# Patient Record
Sex: Male | Born: 1955 | Race: White | Hispanic: No | Marital: Single | Smoking: Never smoker
Health system: Southern US, Community
[De-identification: ages and names within clinical notes are randomized; demographics above are authoritative.]

## PROBLEM LIST (undated history)

## (undated) DIAGNOSIS — E785 Hyperlipidemia, unspecified: Secondary | ICD-10-CM

## (undated) DIAGNOSIS — K219 Gastro-esophageal reflux disease without esophagitis: Secondary | ICD-10-CM

## (undated) HISTORY — PX: BACK SURGERY: SHX140

## (undated) HISTORY — PX: KNEE SURGERY: SHX244

## (undated) HISTORY — DX: Hyperlipidemia, unspecified: E78.5

## (undated) HISTORY — DX: Gastro-esophageal reflux disease without esophagitis: K21.9

## (undated) HISTORY — PX: TONSILLECTOMY: SUR1361

---

## 2001-06-16 ENCOUNTER — Ambulatory Visit (HOSPITAL_COMMUNITY): Admission: RE | Admit: 2001-06-16 | Discharge: 2001-06-16 | Payer: Self-pay | Admitting: Orthopedic Surgery

## 2001-06-16 ENCOUNTER — Encounter: Payer: Self-pay | Admitting: Orthopedic Surgery

## 2001-06-27 ENCOUNTER — Ambulatory Visit (HOSPITAL_BASED_OUTPATIENT_CLINIC_OR_DEPARTMENT_OTHER): Admission: RE | Admit: 2001-06-27 | Discharge: 2001-06-28 | Payer: Self-pay | Admitting: Orthopedic Surgery

## 2006-07-02 ENCOUNTER — Ambulatory Visit (HOSPITAL_COMMUNITY): Admission: RE | Admit: 2006-07-02 | Discharge: 2006-07-02 | Payer: Self-pay | Admitting: *Deleted

## 2007-07-11 ENCOUNTER — Encounter (INDEPENDENT_AMBULATORY_CARE_PROVIDER_SITE_OTHER): Payer: Self-pay | Admitting: *Deleted

## 2007-07-11 ENCOUNTER — Ambulatory Visit (HOSPITAL_COMMUNITY): Admission: RE | Admit: 2007-07-11 | Discharge: 2007-07-11 | Payer: Self-pay | Admitting: *Deleted

## 2008-12-07 ENCOUNTER — Encounter: Admission: RE | Admit: 2008-12-07 | Discharge: 2008-12-07 | Payer: Self-pay | Admitting: *Deleted

## 2009-06-28 ENCOUNTER — Encounter (INDEPENDENT_AMBULATORY_CARE_PROVIDER_SITE_OTHER): Payer: Self-pay | Admitting: *Deleted

## 2009-06-28 ENCOUNTER — Ambulatory Visit (HOSPITAL_COMMUNITY): Admission: RE | Admit: 2009-06-28 | Discharge: 2009-06-28 | Payer: Self-pay | Admitting: *Deleted

## 2010-08-12 IMAGING — RF DG ESOPHAGUS
11 of 13 series · 19 of 24 positions shown · non-contrast
Comparison: None

CLINICAL DATA: Cervical dysphagia

ESOPHOGRAM / BARIUM SWALLOW
TECHNIQUE: Combined double contrast and single contrast
examination performed using effervescent crystals, thick barium
liquid, and thin barium liquid.
Fluoroscopy time:  1.6 minutes.

[Series 1: run · 2 of 7 slices shown (1 of 11)]
[im 1/7]
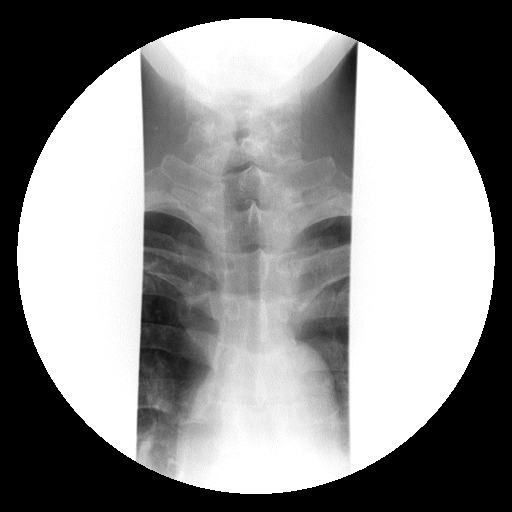
[im 7/7]
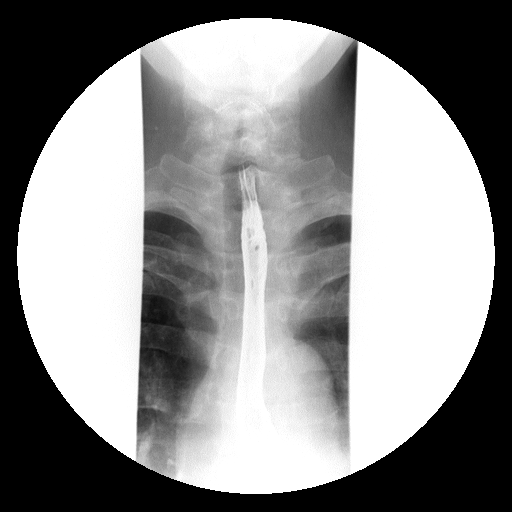

[Series 2: run · 1 of 6 slices shown (2 of 11)]
[im 6/6]
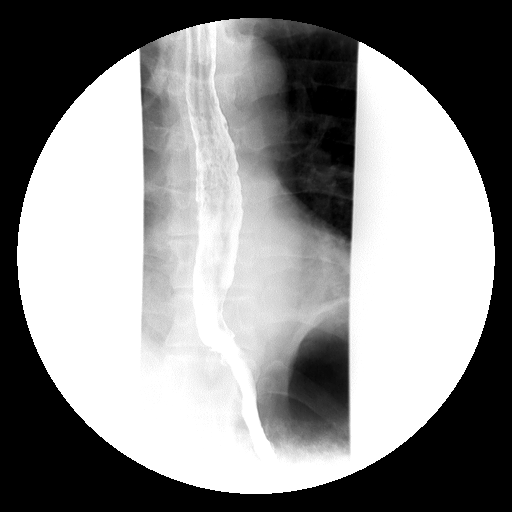

[Series 3: run · 1 of 1 slices shown (3 of 11)]
[im 1/1]
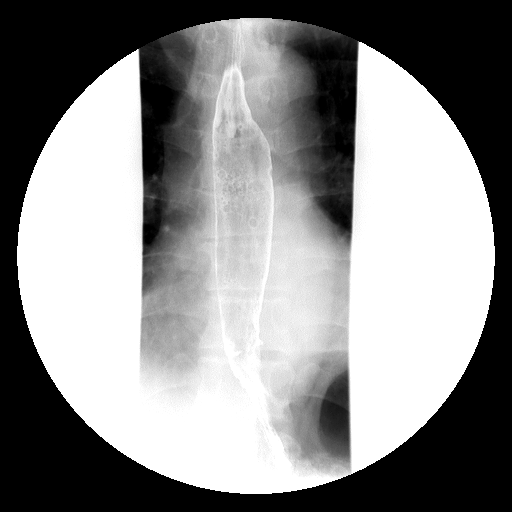

[Series 4: run · 1 of 2 slices shown (4 of 11)]
[im 1/2]
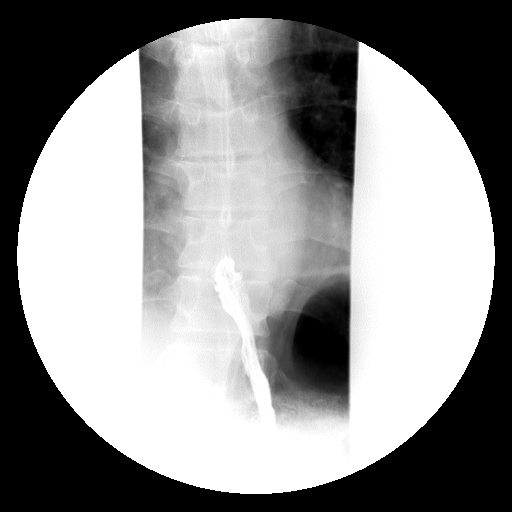

[Series 5: run · 3 of 8 slices shown (5 of 11)]
[im 1/8]
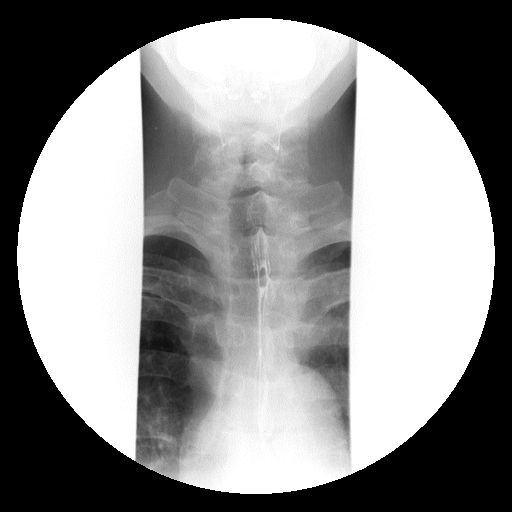
[im 5/8]
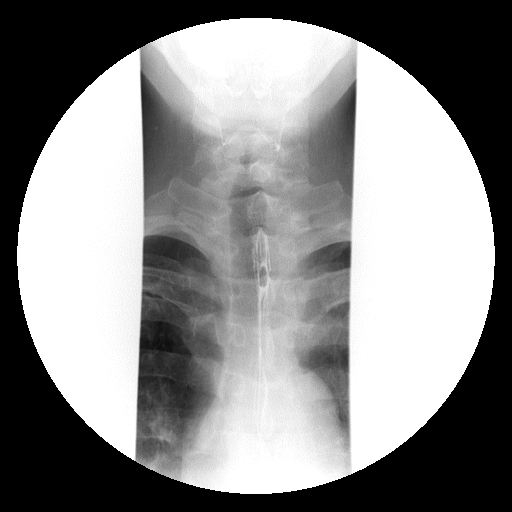
[im 8/8]
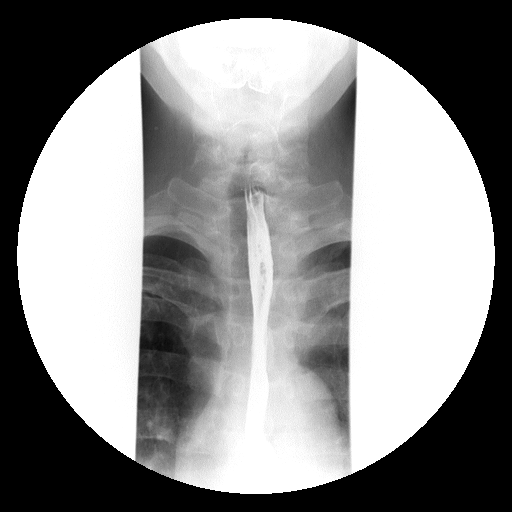

[Series 6: run · 2 of 7 slices shown (6 of 11)]
[im 1/7]
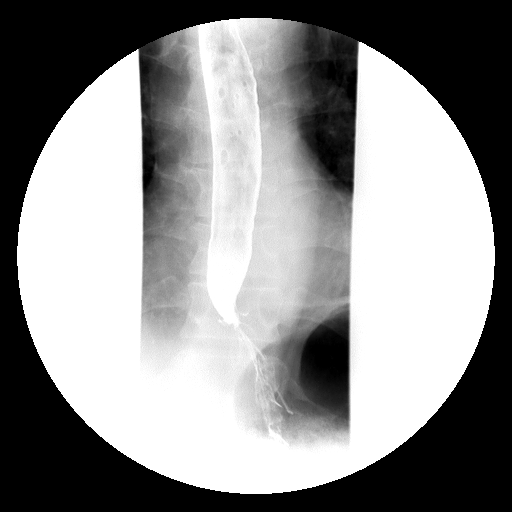
[im 7/7]
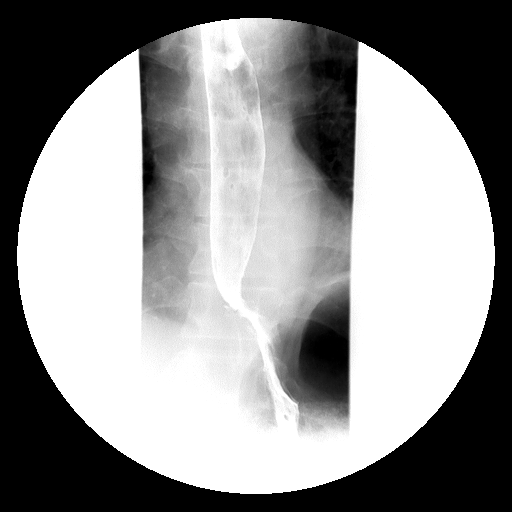

[Series 7: run · 3 of 7 slices shown (7 of 11)]
[im 1/7]
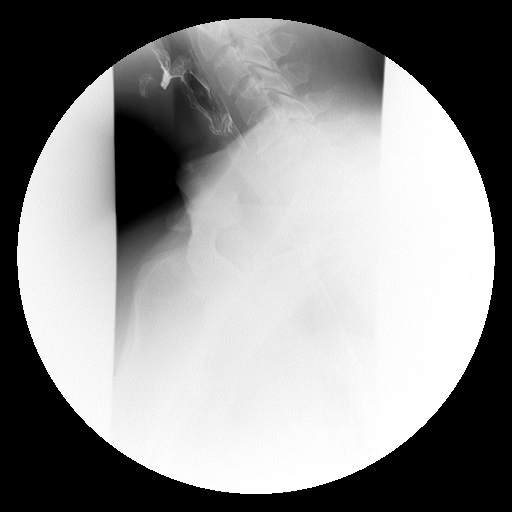
[im 4/7]
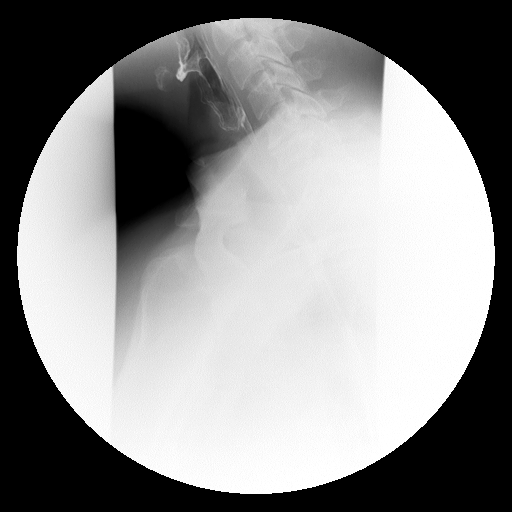
[im 7/7]
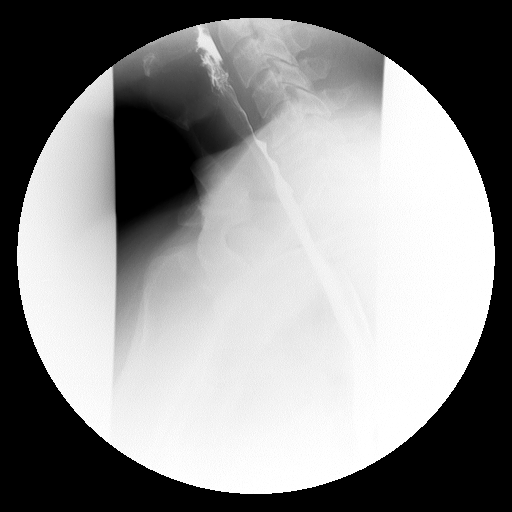

[Series 9: run · 3 of 7 slices shown (8 of 11)]
[im 1/7]
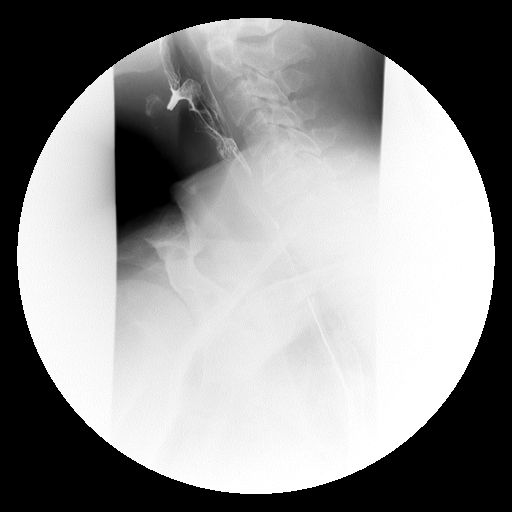
[im 4/7]
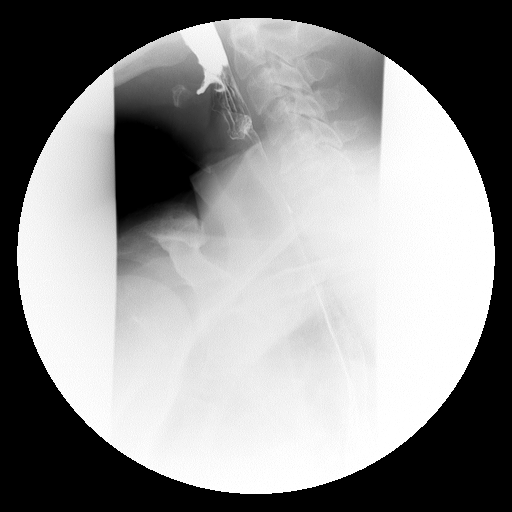
[im 7/7]
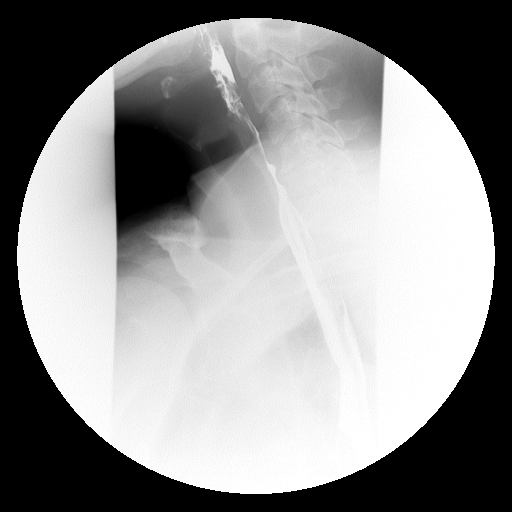

[Series 10: run · 1 of 2 slices shown (9 of 11)]
[im 1/2]
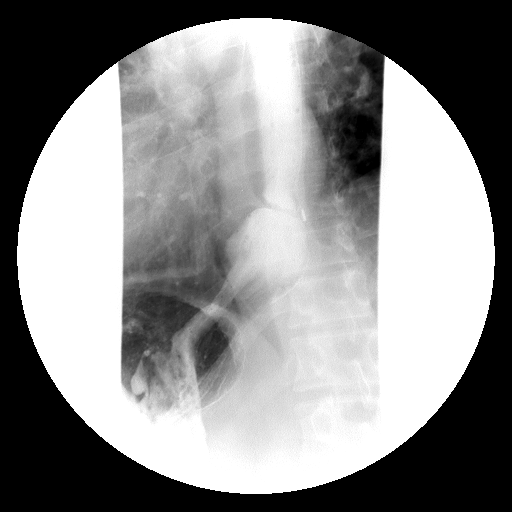

[Series 12: run · 1 of 1 slices shown (10 of 11)]
[im 1/1]
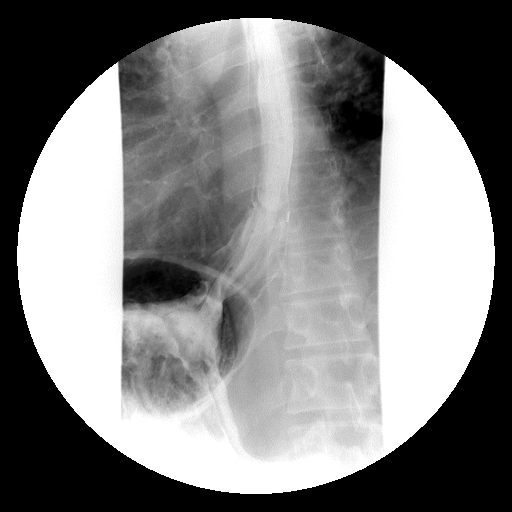

[Series 13: run · 1 of 1 slices shown (11 of 11)]
[im 1/1]
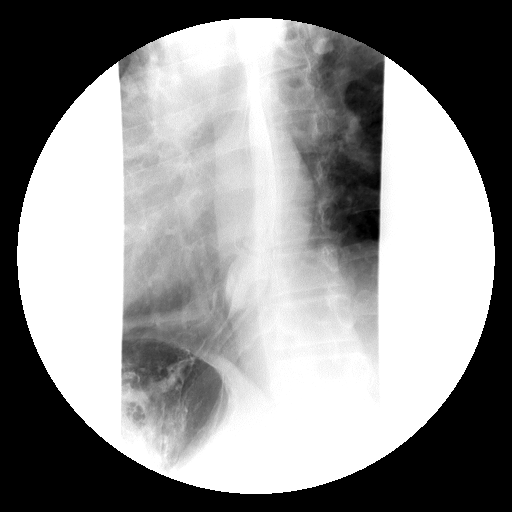

[19 of 24 positions shown; findings below may reference images not displayed]

FINDINGS: The mucosa and motility of the cervical cervical
esophagus are normal.  There is no evidence of an esophageal
stricture.

The patient has had a 5-6 cm sliding hiatal hernia best
demonstrated when the patient is in the prone oblique position.

There is also evidence of at least one and possibly two small
ulcerations in the distal esophagus at the gastroesophageal
junction.  However, there is no stricture or mass at that site.  A
13 mm barium tablet passed immediately from the mouth to the
stomach.
IMPRESSION: 1.  Small ulcerations in the distal esophagus at the
gastroesophageal junction just above the 6 cm sliding hiatal
hernia.
2.  No stricture.
3.  The cervical esophagus appears normal.

## 2011-05-02 NOTE — Op Note (Signed)
Peter Gonzalez, Peter Gonzalez               ACCOUNT NO.:  [id number]   MEDICAL RECORD NO.:  [id number]          PATIENT TYPE:  AMB   LOCATION:  ENDO                         FACILITY:  [facility]   PHYSICIAN:  Georgiana Spinner, M.D.    DATE OF BIRTH:  27-Mar-1956   DATE OF PROCEDURE:  07/11/2007  DATE OF DISCHARGE:                               OPERATIVE REPORT   PROCEDURE:  Upper endoscopy.   INDICATIONS:  Gastroesophageal reflux disease with some dysphagia.   ANESTHESIA:  Demerol 75, Versed 8 mg.   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  They were  photographed.  There was some mild erythema at the squamocolumnar  junction which was photographed and biopsied.  We entered into the  stomach.  The fundus, body, antrum, duodenal bulb, and second portion of  the duodenum all appeared normal.  From this point, the endoscope was  slowly withdrawn taking circumferential views of the duodenal mucosa  until the endoscope had been pulled back in the stomach and placed in  retroflexion to view the stomach from below, and once again, we biopsied  this erythematous change, it did not appeared to be Barrett's esophagus  clinically. The endoscope was straightened and withdrawn taking  circumferential views of the remaining gastric and esophageal mucosa.  The patient's vital signs and pulse oximeter remained stable.  The  patient tolerated the procedure well without apparent complication.   FINDINGS:  Mild erythema at the squamocolumnar junction with two fairly  large pseudodiverticula identified and photographed only.   PLAN:  Await biopsy report.  Continue the patient on therapy and have  the patient follow-up with me as an  outpatient.           ______________________________  Georgiana Spinner, M.D.     GMO/MEDQ  D:  07/11/2007  T:  07/11/2007  Job:  [id number]

## 2011-05-02 NOTE — Op Note (Signed)
Peter Gonzalez, Peter Gonzalez               ACCOUNT NO.:  [id number]   MEDICAL RECORD NO.:  [id number]          PATIENT TYPE:  AMB   LOCATION:  ENDO                         FACILITY:  [facility]   PHYSICIAN:  Georgiana Spinner, M.D.    DATE OF BIRTH:  1956-04-15   DATE OF PROCEDURE:  DATE OF DISCHARGE:                               OPERATIVE REPORT   PROCEDURE:  Upper endoscopy with biopsy.   INDICATIONS:  GERD.   ANESTHESIA:  Fentanyl 75 mcg, Versed 5 mg.   PROCEDURE:  With the patient mildly sedated in the left lateral  decubitus position the Pentax videoscopic endoscope was inserted in the  mouth and passed under direct vision through the esophagus which  appeared normal until we reached distal esophagus and there appeared to  be a rolled esophagus with a pseudodiverticulum which was photographed.  There was also an area of erythema that I could not tell if this was  Barrett's or just the mucosa from the stomach.  So I elected to biopsy  it.  We then entered into the stomach fundus, body, antrum, duodenal  bulb and the second portion of the duodenum, all were visualized  appeared normal.  From this point, the endoscope was slowly withdrawn  taking circumferential views of the duodenal mucosa until the endoscope  had been pulled back into the stomach and placed in retroflexion to view  the stomach from below.  The endoscope was then straightened and  withdrawn, taking circumferential views of the remaining gastric and  esophageal mucosa stopping once again to photograph the area that we  biopsied and view the hiatal hernia from above.  The endoscope was  withdrawn.  The patient's vital signs and pulse oximeter remained  stable.  The patient tolerated the procedure well without apparent  complications.   FINDINGS:  A small hiatal hernia with severe diverticulum noted.  Biopsy  taken.  Await biopsy report.  The patient will call me for results and  follow-up with me as needed as an  outpatient.           ______________________________  Georgiana Spinner, M.D.     GMO/MEDQ  D:  06/28/2009  T:  06/28/2009  Job:  [id number]

## 2011-05-05 NOTE — Op Note (Signed)
NAMEBARRIE, WALE               ACCOUNT NO.:  [id number]   MEDICAL RECORD NO.:  [id number]          PATIENT TYPE:  AMB   LOCATION:  ENDO                         FACILITY:  [facility]   PHYSICIAN:  Georgiana Spinner, M.D.    DATE OF BIRTH:  1956-11-26   DATE OF PROCEDURE:  07/02/2006  DATE OF DISCHARGE:                                 OPERATIVE REPORT   PROCEDURE:  Upper endoscopy.   INDICATIONS:  GERD.   ANESTHESIA:  Fentanyl 75 mcg, Versed 7 mg, Phenergan 12 mg.   PROCEDURE:  With the patient mildly sedated in the left lateral decubitus  position, the Olympus videoscopic endoscope was inserted and passed under  direct vision through the esophagus which appeared normal until we reached  the distal esophagus.  Some changes of esophagitis with esophageal ulcer and  pseudo diverticula were seen and photographed.  We entered into the stomach.  The fundus, body, antrum, duodenal bulb, second portion duodenum were  visualized.  From this point, the endoscope was slowly withdrawn, taking  circumferential views of duodenal mucosa until the endoscope had been pulled  back into the stomach, placed in retroflexion to view the stomach from  below.  The endoscope was straightened and withdrawn, taking circumferential  views of remaining gastric and esophageal mucosa.  The patient's vital  signs, pulse oximeter remained stable.  The patient tolerated the procedure  well without apparent complications.   FINDINGS:  Changes of esophagitis with a pseudodiverticulum.   PLAN:  I had recommended a PPI therapy.  The patient has not started it yet.  Will re-recommend it at this time.  Will have the patient follow up with me  as an outpatient and proceed to colonoscopy.           ______________________________  Georgiana Spinner, M.D.     GMO/MEDQ  D:  07/02/2006  T:  07/02/2006  Job:  [id number]

## 2011-05-05 NOTE — Op Note (Signed)
Peter Gonzalez, Peter Gonzalez               ACCOUNT NO.:  [id number]   MEDICAL RECORD NO.:  [id number]          PATIENT TYPE:  AMB   LOCATION:  ENDO                         FACILITY:  [facility]   PHYSICIAN:  Georgiana Spinner, M.D.    DATE OF BIRTH:  19-Apr-1956   DATE OF PROCEDURE:  07/02/2006  DATE OF DISCHARGE:                                 OPERATIVE REPORT   PROCEDURE:  Colonoscopy.   INDICATIONS:  Colon cancer screening.   ANESTHESIA:  Versed 1 mg.   PROCEDURE:  With the patient mildly sedated in the left lateral decubitus  position, a rectal examination was attempted.  Subsequently, the Olympus  videoscopic colonoscope was inserted into the rectum and passed under direct  vision to cecum, identified by crow's foot of the cecum and appendiceal  orifice, ileocecal valve polyp all of which were photographed.  From this  point, the colonoscope was slowly withdrawn taking circumferential views of  the colonic mucosa stopping only in the rectum which appeared normal on  direct ad showed hemorrhoids on retroflexed view.  The endoscope was  straightened and withdrawn.  The patient's vital signs and pulse oximeter  remained stable.  The patient tolerated procedure well without apparent  complications.   FINDINGS:  Internal hemorrhoids, otherwise unremarkable colonoscopic  examination to cecum.   PLAN:  Consider repeat examination of 5-10 years           ______________________________  Georgiana Spinner, M.D.     GMO/MEDQ  D:  07/02/2006  T:  07/02/2006  Job:  [id number]

## 2011-05-05 NOTE — Op Note (Signed)
[place]  Patient:    Peter Gonzalez, Peter Gonzalez                      MRN: [id number] Proc. Date: 06/27/01 Adm. Date:  [id number] Disc. 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  POSTOPERATIVE DIAGNOSIS: 1. Anterior cruciate ligament insufficiency right knee. 2. Osteochondral defect medial femoral condyle. 3. Medial and lateral meniscal tears.  OPERATION PERFORMED: 1. Anterior cruciate ligament allograft reconstruction. 2. Debridement of osteochondral defect, medial femoral condyle. 3. Debridement of medial meniscus and lateral meniscal tears.  SURGEON:  Nadara Mustard, M.D.  ANESTHESIA:  General.  ESTIMATED BLOOD LOSS:  Minimal.  ANTIBIOTICS:  1 gm of Kefzol.  TOURNIQUET TIME: 84 minutes at 300 mmHg.  DRAINS:  None.  COMPLICATIONS:  None.  INJECTION:  20 cc of 0.25% Marcaine plain with 4 mg of morphine.  DISPOSITION:  To PACU in stable condition.  Plan for 23-hour observation, discharge in the a.m. by anesthesia.  Weightbearing as tolerated on the right with crutches and immobilizer.  INDICATIONS FOR PROCEDURE:  The patient is a 55 year old gentleman who had an ACL injury to his right knee in approximately 1982.  He underwent arthroscopy and manipulation under anesthesia and has had persistent instability symptoms in his right knee and presents at this time for ACL reconstruction.  MRI scanning confirmed the ACL tear.  The patient states that he is unable to perform activities of daily living due to instability of his knee.  The risks and benefits were discussed including infection, neurovascular injury, failure of the ACL graft, persistent pain, stiffness, arthrofibrosis.  The patient states that he understands and wishes to proceed at this time.  DESCRIPTION OF PROCEDURE:  The patient was  brought to outpatient room 5 and underwent a general anesthetic.  After an adequate level of anesthesia was obtained, the patients right lower extremity was prepped using DuraPrep and draped in a sterile field.  The scope was inserted through the inferior lateral portal with a working portal established inferomedially. Visualization of the patellofemoral joint showed there to be no osteochondral changes, no loose bodies.  Examination of the medial jointline showed there to be a large osteochondral defect in the medial femoral condyle.  This was debrided back to bleeding bone.  There was degenerative tearing of the medial meniscus and this was debrided.  There was a large empty notch and the ACL stump was debrided.  The patient appears to have had some impingement from the ACL stump.  The notch was also debrided using the vapor and the shaver.  In the figure 4 position, the lateral jointline showed there to be a small degenerative lateral meniscal tear and this was debrided.  There were no cartilage defects of the lateral jointline.  The medial and lateral gutters had no loose bodies.  The tibial guide was then inserted at 52 degrees and a drill pin was then advanced with the entry portals approximately one thumbbreadth medial and medial to the tibial tubercle.  This was advanced. The 10 mm tibial tunnel was then drilled over the wire and this left approximately 1 to 2 mm of posterior wall of the tibial tunnel and the PCL. The over-the-top 7 mm guide was  then used, the guide wire advanced and the 10 mm tunnel was drilled and this left approximately 1 to 2 mm of posterior wall.  The allograft was then fashioned on the back table with 25 x 10 mm bone plugs both distally and proximally.  Two #5 Ethibonds were passed through the bone plugs for passing the graft.  Using the two-pin passer this was passed through the tibial and femoral tunnels, exited the anterior thigh and the graft was passed  through the tunnels.  The graft was stabilized in place and was stabilized proximally with a 7 x 20 mm Kurosaka screw.  Pulling on the graft there was a good stable fixation.  Distally, with the knee in 30 degrees of reversed Lachman, the distal screw was placed.  This again was a 7 x 20 mm Kurosaka screw.  The joint was then visualized.  All loose bodies were removed.  The knee was placed through a full range of motion.  There was no impingement with full extension. The anterior drawer was negative. Instruments were removed.  The joint was infused with 20 cc of 0.5% Marcaine plain with 4 mg of morphine.  The subcutaneous was closed with 2-0 Vicryl. The skin was closed with proximate staples.  The wound was covered with Adaptic orthopedic sponges, sterile Webril and Ace wrap from toes to thigh. The procedure was performed under tourniquet control after elevation of the leg.  Total tourniquet time of 81 minutes at 300 mmHg.  The patient was taken to PACU in stable condition.  Plan for 23 hour observation, discharge in the morning anesthesia. DD:  06/27/01 TD:  06/28/01 Job: [id number]

## 2013-12-23 ENCOUNTER — Ambulatory Visit (INDEPENDENT_AMBULATORY_CARE_PROVIDER_SITE_OTHER): Payer: PRIVATE HEALTH INSURANCE | Admitting: Podiatry

## 2013-12-23 ENCOUNTER — Encounter: Payer: Self-pay | Admitting: Podiatry

## 2013-12-23 VITALS — BP 117/75 | HR 69 | Resp 16

## 2013-12-23 DIAGNOSIS — Q828 Other specified congenital malformations of skin: Secondary | ICD-10-CM

## 2013-12-23 NOTE — Progress Notes (Signed)
He presents today with a chief complaint of painful lesions plantar aspect of the left foot. States that he's had these for quite some time and it usually has been trimmed by one of our doctors. [place]  Objective: Vital signs are stable he is alert and oriented x3. Pulses are palpable bilateral. Examination of the plantar aspect of the left foot does demonstrate porokeratotic lesions, 2 distally and one more diffuse tyloma to the plantar left heel.  Assessment: Porokeratosis soft tissue lesion calluses.  Plan: Debridement of reactive hyperkeratosis 3 in number left foot.

## 2014-07-15 ENCOUNTER — Ambulatory Visit (INDEPENDENT_AMBULATORY_CARE_PROVIDER_SITE_OTHER): Payer: PRIVATE HEALTH INSURANCE | Admitting: Podiatry

## 2014-07-15 ENCOUNTER — Encounter: Payer: Self-pay | Admitting: Podiatry

## 2014-07-15 VITALS — BP 122/77 | HR 67 | Resp 16

## 2014-07-15 DIAGNOSIS — L84 Corns and callosities: Secondary | ICD-10-CM

## 2014-07-21 NOTE — Progress Notes (Signed)
Subjective:     Patient ID: Peter Gonzalez, male   DOB: 08/19/1956, 58 y.o.   MRN: [id number]  HPI patient presents with painful calluses of both feet   Review of Systems     Objective:   Physical Exam Neurovascular status intact with painful keratotic lesions plantar aspect both feet    Assessment:     Chronic callus formation    Plan:     Debridement lesions on both feet with no iatrogenic bleeding noted
